# Patient Record
Sex: Female | Born: 1970 | Race: White | Hispanic: No | Marital: Married | Smoking: Current every day smoker
Health system: Southern US, Community
[De-identification: ages and names within clinical notes are randomized; demographics above are authoritative.]

## PROBLEM LIST (undated history)

## (undated) DIAGNOSIS — R569 Unspecified convulsions: Secondary | ICD-10-CM

## (undated) DIAGNOSIS — Z789 Other specified health status: Secondary | ICD-10-CM

---

## 2006-05-01 ENCOUNTER — Ambulatory Visit: Payer: Self-pay | Admitting: Nurse Practitioner

## 2010-08-22 ENCOUNTER — Ambulatory Visit: Payer: Self-pay | Admitting: Obstetrics and Gynecology

## 2010-08-29 ENCOUNTER — Ambulatory Visit: Payer: Self-pay | Admitting: Obstetrics and Gynecology

## 2010-08-29 ENCOUNTER — Ambulatory Visit: Payer: Self-pay | Admitting: Oncology

## 2010-09-25 ENCOUNTER — Ambulatory Visit: Payer: Self-pay | Admitting: Oncology

## 2011-03-28 ENCOUNTER — Ambulatory Visit: Payer: Self-pay | Admitting: Obstetrics and Gynecology

## 2011-08-29 ENCOUNTER — Ambulatory Visit: Payer: Self-pay | Admitting: Obstetrics and Gynecology

## 2012-11-19 ENCOUNTER — Ambulatory Visit: Payer: Self-pay | Admitting: Obstetrics and Gynecology

## 2013-12-09 ENCOUNTER — Ambulatory Visit: Payer: Self-pay | Admitting: Obstetrics and Gynecology

## 2015-02-10 ENCOUNTER — Other Ambulatory Visit: Payer: Self-pay | Admitting: Obstetrics and Gynecology

## 2015-02-10 DIAGNOSIS — Z1231 Encounter for screening mammogram for malignant neoplasm of breast: Secondary | ICD-10-CM

## 2015-02-17 ENCOUNTER — Ambulatory Visit
Admission: RE | Admit: 2015-02-17 | Discharge: 2015-02-17 | Disposition: A | Discharge status: Home / Self Care | Payer: Managed Care, Other (non HMO) | Source: Ambulatory Visit | Attending: Obstetrics and Gynecology | Admitting: Obstetrics and Gynecology

## 2015-02-17 DIAGNOSIS — Z1231 Encounter for screening mammogram for malignant neoplasm of breast: Secondary | ICD-10-CM | POA: Diagnosis not present

## 2016-03-04 ENCOUNTER — Other Ambulatory Visit: Payer: Self-pay | Admitting: Obstetrics and Gynecology

## 2016-03-04 DIAGNOSIS — Z1231 Encounter for screening mammogram for malignant neoplasm of breast: Secondary | ICD-10-CM

## 2016-03-29 ENCOUNTER — Ambulatory Visit: Payer: Managed Care, Other (non HMO)

## 2016-04-12 ENCOUNTER — Ambulatory Visit
Admission: RE | Admit: 2016-04-12 | Discharge: 2016-04-12 | Disposition: A | Discharge status: Home / Self Care | Payer: Managed Care, Other (non HMO) | Source: Ambulatory Visit | Attending: Obstetrics and Gynecology | Admitting: Obstetrics and Gynecology

## 2016-04-12 DIAGNOSIS — Z1231 Encounter for screening mammogram for malignant neoplasm of breast: Secondary | ICD-10-CM | POA: Diagnosis present

## 2016-09-30 ENCOUNTER — Encounter: Payer: Self-pay | Admitting: *Deleted

## 2016-10-03 ENCOUNTER — Ambulatory Visit
Admission: RE | Admit: 2016-10-03 | Discharge: 2016-10-03 | Disposition: A | Discharge status: Home / Self Care | Payer: Managed Care, Other (non HMO) | Source: Ambulatory Visit | Attending: Ophthalmology | Admitting: Ophthalmology

## 2016-10-03 ENCOUNTER — Ambulatory Visit: Payer: Managed Care, Other (non HMO) | Admitting: Anesthesiology

## 2016-10-03 ENCOUNTER — Encounter
Admission: RE | Disposition: A | Discharge status: Home / Self Care | Payer: Self-pay | Source: Ambulatory Visit | Attending: Ophthalmology

## 2016-10-03 ENCOUNTER — Encounter: Payer: Self-pay | Admitting: *Deleted

## 2016-10-03 DIAGNOSIS — H25041 Posterior subcapsular polar age-related cataract, right eye: Secondary | ICD-10-CM | POA: Insufficient documentation

## 2016-10-03 DIAGNOSIS — R569 Unspecified convulsions: Secondary | ICD-10-CM | POA: Insufficient documentation

## 2016-10-03 DIAGNOSIS — F17200 Nicotine dependence, unspecified, uncomplicated: Secondary | ICD-10-CM | POA: Diagnosis not present

## 2016-10-03 HISTORY — PX: CATARACT EXTRACTION W/PHACO: SHX586

## 2016-10-03 HISTORY — DX: Other specified health status: Z78.9

## 2016-10-03 HISTORY — DX: Unspecified convulsions: R56.9

## 2016-10-03 LAB — POCT PREGNANCY, URINE: PREG TEST UR: NEGATIVE

## 2016-10-03 SURGERY — PHACOEMULSIFICATION, CATARACT, WITH IOL INSERTION
Anesthesia: Monitor Anesthesia Care | Site: Eye | Laterality: Right | Wound class: Clean

## 2016-10-03 MED ORDER — SODIUM CHLORIDE 0.9 % IV SOLN
INTRAVENOUS | Status: DC
  Administered 2016-10-03: 08:00:00 via INTRAVENOUS

## 2016-10-03 MED ORDER — NA CHONDROIT SULF-NA HYALURON 40-30 MG/ML IO SOLN
INTRAOCULAR | Status: AC
  Filled 2016-10-03: qty 0.5

## 2016-10-03 MED ORDER — MIDAZOLAM HCL 2 MG/2ML IJ SOLN
INTRAMUSCULAR | Status: DC | PRN
  Administered 2016-10-03 (×2): 1 mg via INTRAVENOUS

## 2016-10-03 MED ORDER — FENTANYL CITRATE (PF) 100 MCG/2ML IJ SOLN
INTRAMUSCULAR | Status: AC
  Filled 2016-10-03: qty 2

## 2016-10-03 MED ORDER — MOXIFLOXACIN HCL 0.5 % OP SOLN
OPHTHALMIC | Status: DC | PRN
  Administered 2016-10-03: 0.2 mL via OPHTHALMIC

## 2016-10-03 MED ORDER — EPINEPHRINE PF 1 MG/ML IJ SOLN
INTRAMUSCULAR | Status: DC | PRN
  Administered 2016-10-03: 09:00:00 via OPHTHALMIC

## 2016-10-03 MED ORDER — POVIDONE-IODINE 5 % OP SOLN
OPHTHALMIC | Status: AC
  Filled 2016-10-03: qty 30

## 2016-10-03 MED ORDER — MOXIFLOXACIN HCL 0.5 % OP SOLN: 1.0000 [drp] | Freq: Once | OPHTHALMIC | Status: DC

## 2016-10-03 MED ORDER — SODIUM HYALURONATE 23 MG/ML IO SOLN
INTRAOCULAR | Status: DC | PRN
  Administered 2016-10-03: 0.6 mL via INTRAOCULAR

## 2016-10-03 MED ORDER — NEOMYCIN-POLYMYXIN-DEXAMETH 3.5-10000-0.1 OP OINT
TOPICAL_OINTMENT | OPHTHALMIC | Status: AC
  Filled 2016-10-03: qty 3.5

## 2016-10-03 MED ORDER — MOXIFLOXACIN HCL 0.5 % OP SOLN
OPHTHALMIC | Status: AC
  Filled 2016-10-03: qty 3

## 2016-10-03 MED ORDER — NA CHONDROIT SULF-NA HYALURON 40-30 MG/ML IO SOLN
INTRAOCULAR | Status: DC | PRN
  Administered 2016-10-03: 0.5 mL via INTRAOCULAR

## 2016-10-03 MED ORDER — FENTANYL CITRATE (PF) 100 MCG/2ML IJ SOLN: 25.0000 ug | INTRAMUSCULAR | Status: DC | PRN

## 2016-10-03 MED ORDER — SODIUM HYALURONATE 10 MG/ML IO SOLN
INTRAOCULAR | Status: DC | PRN
  Administered 2016-10-03: 0.55 mL via INTRAOCULAR

## 2016-10-03 MED ORDER — LIDOCAINE HCL (PF) 4 % IJ SOLN
INTRAMUSCULAR | Status: DC | PRN
  Administered 2016-10-03: 4 mL via OPHTHALMIC

## 2016-10-03 MED ORDER — POVIDONE-IODINE 5 % OP SOLN
OPHTHALMIC | Status: DC | PRN
  Administered 2016-10-03: 1 via OPHTHALMIC

## 2016-10-03 MED ORDER — LIDOCAINE HCL (PF) 2 % IJ SOLN
INTRAMUSCULAR | Status: AC
  Filled 2016-10-03: qty 2

## 2016-10-03 MED ORDER — SODIUM HYALURONATE 10 MG/ML IO SOLN
INTRAOCULAR | Status: AC
  Filled 2016-10-03: qty 0.85

## 2016-10-03 MED ORDER — FENTANYL CITRATE (PF) 100 MCG/2ML IJ SOLN
INTRAMUSCULAR | Status: DC | PRN
  Administered 2016-10-03 (×4): 25 ug via INTRAVENOUS

## 2016-10-03 MED ORDER — EPINEPHRINE PF 1 MG/ML IJ SOLN
INTRAMUSCULAR | Status: AC
  Filled 2016-10-03: qty 2

## 2016-10-03 MED ORDER — ARMC OPHTHALMIC DILATING DROPS
1.0000 "application " | OPHTHALMIC | Status: AC
  Administered 2016-10-03 (×3): 1 via OPHTHALMIC

## 2016-10-03 MED ORDER — SODIUM HYALURONATE 23 MG/ML IO SOLN
INTRAOCULAR | Status: AC
Start: 2016-10-03 — End: 2016-10-03
  Filled 2016-10-03: qty 0.6

## 2016-10-03 MED ORDER — ONDANSETRON HCL 4 MG/2ML IJ SOLN: 4.0000 mg | Freq: Once | INTRAMUSCULAR | Status: DC | PRN

## 2016-10-03 MED ORDER — ARMC OPHTHALMIC DILATING DROPS
OPHTHALMIC | Status: AC
  Administered 2016-10-03: 1 via OPHTHALMIC
  Filled 2016-10-03: qty 0.4

## 2016-10-03 MED ORDER — MIDAZOLAM HCL 2 MG/2ML IJ SOLN
INTRAMUSCULAR | Status: AC
  Filled 2016-10-03: qty 2

## 2016-10-03 SURGICAL SUPPLY — 22 items
BANDAGE EYE OVAL (MISCELLANEOUS) ×4 IMPLANT
CANNULA ANT/CHMB 27GA (MISCELLANEOUS) ×4 IMPLANT
CUP MEDICINE 2OZ PLAST GRAD ST (MISCELLANEOUS) ×2 IMPLANT
DISSECTOR HYDRO NUCLEUS 50X22 (MISCELLANEOUS) ×2 IMPLANT
GLOVE BIO SURGEON STRL SZ8 (GLOVE) ×2 IMPLANT
GLOVE BIOGEL M 6.5 STRL (GLOVE) ×2 IMPLANT
GLOVE SURG LX 7.5 STRW (GLOVE) ×1
GLOVE SURG LX STRL 7.5 STRW (GLOVE) ×1 IMPLANT
GOWN STRL REUS W/ TWL LRG LVL3 (GOWN DISPOSABLE) ×2 IMPLANT
GOWN STRL REUS W/TWL LRG LVL3 (GOWN DISPOSABLE) ×2
LENS IOL TECNIS ITEC 25.0 (Intraocular Lens) ×2 IMPLANT
PACK CATARACT (MISCELLANEOUS) ×2 IMPLANT
PACK CATARACT KING (MISCELLANEOUS) ×2 IMPLANT
PACK EYE AFTER SURG (MISCELLANEOUS) ×2 IMPLANT
SOL BSS BAG (MISCELLANEOUS) ×2
SOLUTION BSS BAG (MISCELLANEOUS) ×1 IMPLANT
SUT ETHILON 10 0 CS140 6 (SUTURE) ×2 IMPLANT
SYR 3ML LL SCALE MARK (SYRINGE) ×4 IMPLANT
SYR 5ML LL (SYRINGE) ×2 IMPLANT
SYR TB 1ML 27GX1/2 LL (SYRINGE) ×2 IMPLANT
WATER STERILE IRR 250ML POUR (IV SOLUTION) ×2 IMPLANT
WIPE NON LINTING 3.25X3.25 (MISCELLANEOUS) ×2 IMPLANT

## 2016-10-03 NOTE — Discharge Instructions (Signed)
Eye Surgery Discharge Instructions  Expect mild scratchy sensation or mild soreness. DO NOT RUB YOUR EYE!  The day of surgery:  Minimal physical activity, but bed rest is not required  No reading, computer work, or close hand work  No bending, lifting, or straining.  May watch TV  For 24 hours:  No driving, legal decisions, or alcoholic beverages  Safety precautions  Eat anything you prefer: It is better to start with liquids, then soup then solid foods.  _____ Eye patch should be worn until postoperative exam tomorrow.  ____ Solar shield eyeglasses should be worn for comfort in the sunlight/patch while sleeping  Resume all regular medications including aspirin or Coumadin if these were discontinued prior to surgery. You may shower, bathe, shave, or wash your hair. Tylenol may be taken for mild discomfort.  Call your doctor if you experience significant pain, nausea, or vomiting, fever > 101 or other signs of infection. [phone] or [phone] Specific instructions:  Follow-up Information    [place] CraneKing, Bradley Mark, MD Follow up on 10/04/2016.   Specialty:  Ophthalmology Why:  9:30 am Contact information: [address] [place] [address] [phone]

## 2016-10-03 NOTE — Anesthesia Preprocedure Evaluation (Signed)
Anesthesia Evaluation  Patient identified by MRN, date of birth, ID band Patient awake    Reviewed: Allergy & Precautions, NPO status , Patient's Chart, lab work & pertinent test results, reviewed documented beta blocker date and time   Airway Mallampati: II  TM Distance: >3 FB     Dental  (+) Chipped   Pulmonary Current Smoker,           Cardiovascular      Neuro/Psych Seizures -,     GI/Hepatic   Endo/Other    Renal/GU      Musculoskeletal   Abdominal   Peds  Hematology   Anesthesia Other Findings   Reproductive/Obstetrics                             Anesthesia Physical Anesthesia Plan  ASA: II  Anesthesia Plan: MAC   Post-op Pain Management:    Induction:   Airway Management Planned:   Additional Equipment:   Intra-op Plan:   Post-operative Plan:   Informed Consent: I have reviewed the patients History and Physical, chart, labs and discussed the procedure including the risks, benefits and alternatives for the proposed anesthesia with the patient or authorized representative who has indicated his/her understanding and acceptance.     Plan Discussed with: CRNA  Anesthesia Plan Comments:         Anesthesia Quick Evaluation

## 2016-10-03 NOTE — Anesthesia Post-op Follow-up Note (Cosign Needed)
Anesthesia QCDR form completed.        

## 2016-10-03 NOTE — Anesthesia Postprocedure Evaluation (Signed)
Anesthesia Post Note  Patient: Beth Nguyen  Procedure(s) Performed: Procedure(s) (LRB): CATARACT EXTRACTION PHACO AND INTRAOCULAR LENS PLACEMENT (IOC) (Right)  Patient location during evaluation: PACU Anesthesia Type: MAC Level of consciousness: awake and alert Pain management: pain level controlled Vital Signs Assessment: post-procedure vital signs reviewed and stable Respiratory status: spontaneous breathing, nonlabored ventilation, respiratory function stable and patient connected to nasal cannula oxygen Cardiovascular status: stable and blood pressure returned to baseline Anesthetic complications: no     Last Vitals:  Vitals:   10/03/16 0743 10/03/16 0939  BP: 99/62 110/71  Pulse: 66 64  Resp: 20 12  Temp: 36.6 C 36.3 C    Last Pain:  Vitals:   10/03/16 0939  TempSrc: Temporal                 Aengus Sauceda,  Clearnce Sorrel

## 2016-10-03 NOTE — Op Note (Signed)
OPERATIVE NOTE  Beth MeyerGeorginia J Nguyen [id number] 10/03/2016   PREOPERATIVE DIAGNOSIS:  Nuclear sclerotic cataract right eye.  H25.11  and posterior polar cataract, right eye.   POSTOPERATIVE DIAGNOSIS:    Nuclear sclerotic cataract right eye.   and posterior polar cataract, right eye.    PROCEDURE:  Phacoemusification with posterior chamber intraocular lens placement of the right eye   LENS:   Implant Name Type Inv. Item Serial No. Manufacturer Lot No. LRB No. Used  LENS IOL DIOP 25.0 - [id number] Intraocular Lens LENS IOL DIOP 25.0 [id number] [id number] AMO   Right 1       PCB00 +25.0   ULTRASOUND TIME: 0 minutes 6.4 seconds.  CDE 0.11   SURGEON:  Willey BladeBradley Kayode Petion, MD, MPH  ANESTHESIOLOGIST: Anesthesiologist: Berdine Addisonhomas, Mathai, MD CRNA: Irving BurtonBachich, Jennifer, CRNA; Ginger CarneMichelet, Stephanie, CRNA   ANESTHESIA:  Topical with tetracaine drops augmented with 1% preservative-free intracameral lidocaine.  ESTIMATED BLOOD LOSS: less than 1 mL.   COMPLICATIONS:  None.   DESCRIPTION OF PROCEDURE:  The patient was identified in the holding room and transported to the operating room and placed in the supine position under the operating microscope.  The right eye was identified as the operative eye and it was prepped and draped in the usual sterile ophthalmic fashion.   A 1.0 millimeter clear-corneal paracentesis was made at the 10:30 position. 0.5 ml of preservative-free 1% lidocaine with epinephrine was injected into the anterior chamber.  The anterior chamber was filled with Healon 5 viscoelastic.  A 2.4 millimeter keratome was used to make a near-clear corneal incision at the 8:00 position.  A curvilinear capsulorrhexis was made with a cystotome and capsulorrhexis forceps.    Given the posterior polar cataract, hydrodissection was NOT performed.   Gentle slow phacoemulsification was performed to remove the nucleus and part of the epinucleus.  Viscoat was used to employ the "reverse flower bloom" technique for  viscodissection down to the posterior polar cataract.  The remaining lens fragments were removed using the I/A handpiece.  Healon was then placed into the capsular bag to distend it for lens placement.  A lens was then injected into the capsular bag.  The remaining viscoelastic was aspirated.  There was moderate positive posterior pressure.   Wounds were hydrated with balanced salt solution.  During hydration of the paracentesis wound an accidental hydrodissection of 1/4 of the superotemporal cornea was performed, presumbably the epithelium.  The wounds remained leaky.  A 10-0 nylon was placed at the temporal wound.   Intracameral vigamox 0.1 mL undiluted was injected into the eye and a drop placed onto the ocular surface.  Maxitrol ointment, a patch and shield were placed on the eye.  No wound leaks were noted.  The patient was taken to the recovery room in stable condition.  Willey BladeBradley Rehmat Murtagh 10/03/2016, 9:37 AM

## 2016-10-03 NOTE — H&P (Signed)
The History and Physical notes are on paper, have been signed, and are to be scanned.   I have examined the patient and there are no changes to the H&P.   Willey BladeBradley Maitlyn Penza 10/03/2016 8:40 AM

## 2016-10-03 NOTE — Transfer of Care (Signed)
Immediate Anesthesia Transfer of Care Note  Patient: Beth Nguyen  Procedure(s) Performed: Procedure(s) with comments: CATARACT EXTRACTION PHACO AND INTRAOCULAR LENS PLACEMENT (IOC) (Right) - [place] 6.4 AP% 1.7 CDE 0.11 Fluid Pack lot # [id number]  Patient Location: PACU  Anesthesia Type:MAC  Level of Consciousness: awake, alert  and oriented  Airway & Oxygen Therapy: Patient Spontanous Breathing  Post-op Assessment: Report given to RN  Post vital signs: stable  Last Vitals:  Vitals:   10/03/16 0743 10/03/16 0939  BP: 99/62 110/71  Pulse: 66 64  Resp: 20 12  Temp: 36.6 C 36.3 C    Last Pain:  Vitals:   10/03/16 0939  TempSrc: Temporal      Patients Stated Pain Goal: 0 (53/29/92 4268)  Complications: No apparent anesthesia complications

## 2016-12-19 ENCOUNTER — Encounter: Payer: Self-pay | Admitting: *Deleted

## 2016-12-26 ENCOUNTER — Ambulatory Visit
Admission: RE | Admit: 2016-12-26 | Discharge: 2016-12-26 | Disposition: A | Discharge status: Home / Self Care | Payer: Managed Care, Other (non HMO) | Source: Ambulatory Visit | Attending: Ophthalmology | Admitting: Ophthalmology

## 2016-12-26 ENCOUNTER — Encounter: Payer: Self-pay | Admitting: *Deleted

## 2016-12-26 ENCOUNTER — Ambulatory Visit: Payer: Managed Care, Other (non HMO) | Admitting: Anesthesiology

## 2016-12-26 ENCOUNTER — Encounter
Admission: RE | Disposition: A | Discharge status: Home / Self Care | Payer: Self-pay | Source: Ambulatory Visit | Attending: Ophthalmology

## 2016-12-26 DIAGNOSIS — H2512 Age-related nuclear cataract, left eye: Secondary | ICD-10-CM | POA: Insufficient documentation

## 2016-12-26 DIAGNOSIS — H268 Other specified cataract: Secondary | ICD-10-CM | POA: Insufficient documentation

## 2016-12-26 DIAGNOSIS — F17200 Nicotine dependence, unspecified, uncomplicated: Secondary | ICD-10-CM | POA: Insufficient documentation

## 2016-12-26 HISTORY — PX: CATARACT EXTRACTION W/PHACO: SHX586

## 2016-12-26 SURGERY — PHACOEMULSIFICATION, CATARACT, WITH IOL INSERTION
Anesthesia: Monitor Anesthesia Care | Site: Eye | Laterality: Left | Wound class: Clean

## 2016-12-26 MED ORDER — NA CHONDROIT SULF-NA HYALURON 40-30 MG/ML IO SOLN
INTRAOCULAR | Status: DC | PRN
  Administered 2016-12-26: 1 mL via INTRAOCULAR

## 2016-12-26 MED ORDER — FENTANYL CITRATE (PF) 100 MCG/2ML IJ SOLN
INTRAMUSCULAR | Status: AC
  Filled 2016-12-26: qty 2

## 2016-12-26 MED ORDER — SODIUM HYALURONATE 10 MG/ML IO SOLN
INTRAOCULAR | Status: DC | PRN
  Administered 2016-12-26: 0.55 mL via INTRAOCULAR

## 2016-12-26 MED ORDER — LIDOCAINE HCL (PF) 4 % IJ SOLN
INTRAOCULAR | Status: DC | PRN
  Administered 2016-12-26: 4 mL via OPHTHALMIC

## 2016-12-26 MED ORDER — SODIUM CHLORIDE 0.9 % IV SOLN
INTRAVENOUS | Status: DC
  Administered 2016-12-26: 50 mL/h via INTRAVENOUS

## 2016-12-26 MED ORDER — MOXIFLOXACIN HCL 0.5 % OP SOLN
OPHTHALMIC | Status: AC
  Filled 2016-12-26: qty 3

## 2016-12-26 MED ORDER — SODIUM HYALURONATE 23 MG/ML IO SOLN
INTRAOCULAR | Status: DC | PRN
  Administered 2016-12-26: 0.6 mL via INTRAOCULAR

## 2016-12-26 MED ORDER — LIDOCAINE HCL (PF) 4 % IJ SOLN
INTRAMUSCULAR | Status: AC
  Filled 2016-12-26: qty 5

## 2016-12-26 MED ORDER — MIDAZOLAM HCL 2 MG/2ML IJ SOLN
INTRAMUSCULAR | Status: DC | PRN
Start: 2016-12-26 — End: 2016-12-26
  Administered 2016-12-26: 2 mg via INTRAVENOUS

## 2016-12-26 MED ORDER — EPINEPHRINE PF 1 MG/ML IJ SOLN
INTRAMUSCULAR | Status: AC
  Filled 2016-12-26: qty 2

## 2016-12-26 MED ORDER — ARMC OPHTHALMIC DILATING DROPS
1.0000 "application " | OPHTHALMIC | Status: DC
  Administered 2016-12-26 (×4): 1 via OPHTHALMIC

## 2016-12-26 MED ORDER — MOXIFLOXACIN HCL 0.5 % OP SOLN
OPHTHALMIC | Status: DC | PRN
  Administered 2016-12-26: 0.2 mL via OPHTHALMIC

## 2016-12-26 MED ORDER — POVIDONE-IODINE 5 % OP SOLN
OPHTHALMIC | Status: DC | PRN
  Administered 2016-12-26: 1 via OPHTHALMIC

## 2016-12-26 MED ORDER — CARBACHOL 0.01 % IO SOLN
INTRAOCULAR | Status: DC | PRN
  Administered 2016-12-26: 0.5 mL via INTRAOCULAR

## 2016-12-26 MED ORDER — MIDAZOLAM HCL 2 MG/2ML IJ SOLN
INTRAMUSCULAR | Status: AC
  Filled 2016-12-26: qty 2

## 2016-12-26 MED ORDER — SODIUM HYALURONATE 23 MG/ML IO SOLN
INTRAOCULAR | Status: AC
  Filled 2016-12-26: qty 0.6

## 2016-12-26 MED ORDER — ARMC OPHTHALMIC DILATING DROPS
OPHTHALMIC | Status: AC
  Administered 2016-12-26: 1 via OPHTHALMIC
  Filled 2016-12-26: qty 0.4

## 2016-12-26 MED ORDER — BSS IO SOLN
INTRAOCULAR | Status: DC | PRN
  Administered 2016-12-26: 08:00:00 via OPHTHALMIC

## 2016-12-26 MED ORDER — POVIDONE-IODINE 5 % OP SOLN
OPHTHALMIC | Status: AC
  Filled 2016-12-26: qty 30

## 2016-12-26 MED ORDER — MOXIFLOXACIN HCL 0.5 % OP SOLN: 1.0000 [drp] | OPHTHALMIC | Status: DC | PRN

## 2016-12-26 MED ORDER — FENTANYL CITRATE (PF) 100 MCG/2ML IJ SOLN
INTRAMUSCULAR | Status: DC | PRN
  Administered 2016-12-26 (×2): 50 ug via INTRAVENOUS

## 2016-12-26 SURGICAL SUPPLY — 16 items
DISSECTOR HYDRO NUCLEUS 50X22 (MISCELLANEOUS) ×2 IMPLANT
GLOVE BIO SURGEON STRL SZ8 (GLOVE) ×2 IMPLANT
GLOVE BIOGEL M 6.5 STRL (GLOVE) ×2 IMPLANT
GLOVE SURG LX 7.5 STRW (GLOVE) ×1
GLOVE SURG LX STRL 7.5 STRW (GLOVE) ×1 IMPLANT
GOWN STRL REUS W/ TWL LRG LVL3 (GOWN DISPOSABLE) ×2 IMPLANT
GOWN STRL REUS W/TWL LRG LVL3 (GOWN DISPOSABLE) ×2
LABEL CATARACT MEDS ST (LABEL) ×2 IMPLANT
LENS IOL TECNIS ITEC 24.5 (Intraocular Lens) ×2 IMPLANT
PACK CATARACT (MISCELLANEOUS) ×2 IMPLANT
PACK CATARACT KING (MISCELLANEOUS) ×2 IMPLANT
PACK EYE AFTER SURG (MISCELLANEOUS) ×2 IMPLANT
SOL BSS BAG (MISCELLANEOUS) ×2
SOLUTION BSS BAG (MISCELLANEOUS) ×1 IMPLANT
WATER STERILE IRR 250ML POUR (IV SOLUTION) ×2 IMPLANT
WIPE NON LINTING 3.25X3.25 (MISCELLANEOUS) ×2 IMPLANT

## 2016-12-26 NOTE — Anesthesia Post-op Follow-up Note (Cosign Needed)
Anesthesia QCDR form completed.        

## 2016-12-26 NOTE — Anesthesia Postprocedure Evaluation (Signed)
Anesthesia Post Note  Patient: Beth Nguyen  Procedure(s) Performed: Procedure(s) (LRB): CATARACT EXTRACTION PHACO AND INTRAOCULAR LENS PLACEMENT (IOC) (Left)  Patient location during evaluation: Short Stay Anesthesia Type: MAC Level of consciousness: awake, awake and alert and oriented Pain management: pain level controlled Vital Signs Assessment: post-procedure vital signs reviewed and stable Respiratory status: spontaneous breathing Cardiovascular status: stable Postop Assessment: no headache and adequate PO intake Anesthetic complications: no     Last Vitals:  Vitals:   12/26/16 0651 12/26/16 0846  BP: 98/69 102/71  Pulse: 69 62  Resp: 16 16  Temp: 36.5 C     Last Pain:  Vitals:   12/26/16 0651  TempSrc: Burnett Corrente

## 2016-12-26 NOTE — Discharge Instructions (Signed)

## 2016-12-26 NOTE — H&P (Signed)
The History and Physical notes are on paper, have been signed, and are to be scanned.   I have examined the patient and there are no changes to the H&P.   Willey BladeBradley Calbert Hulsebus 12/26/2016 10:34 AM

## 2016-12-26 NOTE — Transfer of Care (Signed)
Immediate Anesthesia Transfer of Care Note  Patient: Beth Nguyen  Procedure(s) Performed: Procedure(s) with comments: CATARACT EXTRACTION PHACO AND INTRAOCULAR LENS PLACEMENT (IOC) (Left) - [place] 00:09.1 AP% 1.8 CDE 0.16 FLUID PACK LOT # [id number]  Patient Location: PACU and Short Stay  Anesthesia Type:MAC  Level of Consciousness: awake, alert  and oriented  Airway & Oxygen Therapy: Patient Spontanous Breathing  Post-op Assessment: Report given to RN  Post vital signs: Reviewed and stable  Last Vitals:  Vitals:   12/26/16 0651 12/26/16 0846  BP: 98/69 102/71  Pulse: 69 62  Resp: 16 16  Temp: 36.5 C     Last Pain:  Vitals:   12/26/16 0651  TempSrc: Oral      Patients Stated Pain Goal: 0 (70/14/10 3013)  Complications: No apparent anesthesia complications

## 2016-12-26 NOTE — Op Note (Signed)
OPERATIVE NOTE  Beth MeyerGeorginia J Nguyen [id number] 12/26/2016   PREOPERATIVE DIAGNOSIS:  Nuclear sclerotic cataract left eye.  H25.12 and posterior polar cataract, left eye.   POSTOPERATIVE DIAGNOSIS:    same.     PROCEDURE:  Phacoemusification with posterior chamber intraocular lens placement of the left eye   LENS:   Implant Name Type Inv. Item Serial No. Manufacturer Lot No. LRB No. Used  LENS IOL DIOP 24.5 - [id number] Intraocular Lens LENS IOL DIOP 24.5 [id number] AMO   Left 1       PCB00 +24.5   ULTRASOUND TIME: 0 minutes 9 seconds.  CDE 0.16   SURGEON:  Willey BladeBradley Evalyn Shultis, MD, MPH   ANESTHESIA:  Topical with tetracaine drops augmented with 1% preservative-free intracameral lidocaine.  ESTIMATED BLOOD LOSS: <1 mL   COMPLICATIONS:  None.   DESCRIPTION OF PROCEDURE:  The patient was identified in the holding room and transported to the operating room and placed in the supine position under the operating microscope.  The left eye was identified as the operative eye and it was prepped and draped in the usual sterile ophthalmic fashion.   A 1.0 millimeter clear-corneal paracentesis was made at the 5:00 position. 0.5 ml of preservative-free 1% lidocaine with epinephrine was injected into the anterior chamber.  The anterior chamber was filled with Healon 5 viscoelastic.  A 2.4 millimeter keratome was used to make a near-clear corneal incision at the 2:00 position.  A curvilinear capsulorrhexis was made with a cystotome and capsulorrhexis forceps.   Because of the posterior polar cataract neither hydrodissection nor hydrodelineation were performed.   Phacoemulsification was then used in stop and chop fashion to remove the lens nucleus and epinucleus.  The posterior polar cataract was aspirated without difficulty or compromise to the posterior capsule. The remaining cortex was then removed using the irrigation and aspiration handpiece.   Healon was then placed into the capsular bag to distend it  for lens placement.  A lens was then injected into the capsular bag.  The remaining viscoelastic was aspirated.   Wounds were hydrated with balanced salt solution.  The anterior chamber was inflated to a physiologic pressure with balanced salt solution.   Intracameral vigamox 0.1 mL undiltued was injected into the eye and a drop placed onto the ocular surface.  No wound leaks were noted.  The patient was taken to the recovery room in stable condition without complications of anesthesia or surgery  Willey BladeBradley [name] 12/26/2016, 8:43 AM

## 2016-12-26 NOTE — Anesthesia Preprocedure Evaluation (Signed)
Anesthesia Evaluation  Patient identified by MRN, date of birth, ID band Patient awake    Reviewed: Allergy & Precautions, NPO status , Patient's Chart, lab work & pertinent test results  History of Anesthesia Complications Negative for: history of anesthetic complications  Airway Mallampati: II  TM Distance: >3 FB Neck ROM: Full    Dental no notable dental hx.    Pulmonary neg sleep apnea, neg COPD, Current Smoker,    breath sounds clear to auscultation- rhonchi (-) wheezing      Cardiovascular Exercise Tolerance: Good (-) hypertension(-) CAD and (-) Past MI  Rhythm:Regular Rate:Normal - Systolic murmurs and - Diastolic murmurs    Neuro/Psych Seizures - (childhood),  negative psych ROS   GI/Hepatic negative GI ROS, Neg liver ROS,   Endo/Other  negative endocrine ROSneg diabetes  Renal/GU negative Renal ROS     Musculoskeletal negative musculoskeletal ROS (+)   Abdominal (+) - obese,   Peds  Hematology negative hematology ROS (+)   Anesthesia Other Findings Past Medical History: No date: Medical history non-contributory No date: Prematurity     Comment:  patient was approximately 2 months premature. No date: Seizures (HCC)     Comment:  AS A BABY   Reproductive/Obstetrics                             Anesthesia Physical Anesthesia Plan  ASA: II  Anesthesia Plan: MAC   Post-op Pain Management:    Induction: Intravenous  PONV Risk Score and Plan: 1 and Midazolam  Airway Management Planned: Natural Airway  Additional Equipment:   Intra-op Plan:   Post-operative Plan:   Informed Consent: I have reviewed the patients History and Physical, chart, labs and discussed the procedure including the risks, benefits and alternatives for the proposed anesthesia with the patient or authorized representative who has indicated his/her understanding and acceptance.     Plan Discussed  with: CRNA and Anesthesiologist  Anesthesia Plan Comments:         Anesthesia Quick Evaluation

## 2017-08-15 ENCOUNTER — Encounter: Payer: Self-pay | Admitting: Certified Nurse Midwife

## 2017-08-15 ENCOUNTER — Ambulatory Visit (INDEPENDENT_AMBULATORY_CARE_PROVIDER_SITE_OTHER): Payer: Managed Care, Other (non HMO) | Admitting: Certified Nurse Midwife

## 2017-08-15 VITALS — BP 94/60 | HR 81 | Ht 64.0 in | Wt 133.1 lb

## 2017-08-15 DIAGNOSIS — Z0000 Encounter for general adult medical examination without abnormal findings: Secondary | ICD-10-CM

## 2017-08-15 NOTE — Progress Notes (Signed)
GYNECOLOGY ANNUAL PREVENTATIVE CARE ENCOUNTER NOTE  Subjective:   Beth Nguyen is a 47 y.o. G74P0101 female here for a routine annual gynecologic exam.  Current complaints: none.   Denies abnormal vaginal bleeding, discharge, pelvic pain, problems with intercourse or other gynecologic concerns.    Gynecologic History No LMP recorded (lmp unknown). Patient is postmenopausal. Contraception: post menopausal status Last Pap: 2 yrs ago. Results were: normal, has hx of abnormal has had cryo surgery Last mammogram: a few years ago. Results were: normal  Obstetric History OB History  Gravida Para Term Preterm AB Living  1 1   1   1   SAB TAB Ectopic Multiple Live Births          1    # Outcome Date GA Lbr Len/2nd Weight Sex Delivery Anes PTL Lv  1 Preterm 74    F CS-Unspec  Y LIV    Past Medical History:  Diagnosis Date  . Medical history non-contributory   . Prematurity    patient was approximately 2 months premature.  . Seizures (HCC)    AS A BABY    Past Surgical History:  Procedure Laterality Date  . CATARACT EXTRACTION W/PHACO Right 10/03/2016   Procedure: CATARACT EXTRACTION PHACO AND INTRAOCULAR LENS PLACEMENT (IOC);  Surgeon: [place] Crane, MD;  Location: [facility] ORS;  Service: Ophthalmology;  Laterality: Right;  [place] 6.4 AP% 1.7 CDE 0.11 Fluid Pack lot # [id number]  . CATARACT EXTRACTION W/PHACO Left 12/26/2016   Procedure: CATARACT EXTRACTION PHACO AND INTRAOCULAR LENS PLACEMENT (IOC);  Surgeon: [place] Crane, MD;  Location: [facility] ORS;  Service: Ophthalmology;  Laterality: Left;  [place] 00:09.1 AP% 1.8 CDE 0.16 FLUID PACK LOT # [id number]  . CESAREAN SECTION      Current Outpatient Medications on File Prior to Visit  Medication Sig Dispense Refill  . acetaminophen (TYLENOL) 500 MG tablet Take by mouth.     No current facility-administered medications on file prior to visit.     No Known Allergies  Social History   Socioeconomic History  . Marital status:  Married    Spouse name: Not on file  . Number of children: Not on file  . Years of education: Not on file  . Highest education level: Not on file  Occupational History  . Not on file  Social Needs  . Financial resource strain: Not on file  . Food insecurity:    Worry: Not on file    Inability: Not on file  . Transportation needs:    Medical: Not on file    Non-medical: Not on file  Tobacco Use  . Smoking status: Current Every Day Smoker    Packs/day: 1.00  . Smokeless tobacco: Never Used  Substance and Sexual Activity  . Alcohol use: Yes    Comment: OCCAS  . Drug use: Never  . Sexual activity: Yes    Birth control/protection: None  Lifestyle  . Physical activity:    Days per week: Not on file    Minutes per session: Not on file  . Stress: Not on file  Relationships  . Social connections:    Talks on phone: Not on file    Gets together: Not on file    Attends religious service: Not on file    Active member of club or organization: Not on file    Attends meetings of clubs or organizations: Not on file    Relationship status: Not on file  . Intimate partner violence:    Fear  of current or ex partner: Not on file    Emotionally abused: Not on file    Physically abused: Not on file    Forced sexual activity: Not on file  Other Topics Concern  . Not on file  Social History Narrative  . Not on file    Family History  Problem Relation Age of Onset  . Cancer Mother   . Cancer Father   . Diabetes Brother     The following portions of the patient's history were reviewed and updated as appropriate: allergies, current medications, past family history, past medical history, past social history, past surgical history and problem list.  Review of Systems Pertinent items noted in HPI and remainder of comprehensive ROS otherwise negative.   Objective:  BP 94/60   Pulse 81   Ht 5\' 4"  (1.626 m)   Wt 133 lb 1 oz (60.4 kg)   LMP  (LMP Unknown)   BMI 22.84 kg/m   CONSTITUTIONAL: Well-developed, well-nourished female in no acute distress.  HENT:  Normocephalic, atraumatic, External right and left ear normal. Oropharynx is clear and moist EYES: Conjunctivae and EOM are normal. Pupils are equal, round, and reactive to light. No scleral icterus.  NECK: Normal range of motion, supple, no masses.  Normal thyroid.  SKIN: Skin is warm and dry. No rash noted. Not diaphoretic. No erythema. No pallor. NEUROLOGIC: Alert and oriented to person, place, and time. Normal reflexes, muscle tone coordination. No cranial nerve deficit noted. PSYCHIATRIC: Normal mood and affect. Normal behavior. Normal judgment and thought content. CARDIOVASCULAR: Normal heart rate noted, regular rhythm RESPIRATORY: Clear to auscultation bilaterally. Effort and breath sounds normal, no problems with respiration noted. BREASTS: Symmetric in size. No masses, skin changes, nipple drainage, or lymphadenopathy.Left breast @ 9 o'clock 2 in from nipple mass noted that is 1 cm in size round mobile non tender. Pt states she has had it since have her child . It has no changed.  ABDOMEN: Soft, normal bowel sounds, no distention noted.  No tenderness, rebound or guarding.  PELVIC: Normal appearing external genitalia; normal appearing vaginal mucosa and cervix.  No abnormal discharge noted.  Pap smear obtained.  Normal uterine size, no other palpable masses, no uterine or adnexal tenderness. MUSCULOSKELETAL: Normal range of motion. No tenderness.  No cyanosis, clubbing, or edema.  2+ distal pulses.   Assessment and Plan:  1. Annual physical exam - Hemoglobin A1C - Pap IG and HPV (high risk) DNA detection - MS DIGITAL SCREENING BILATERAL; Future  Will follow up results of pap smear and manage accordingly. Mammogram scheduled Smoking cessation encouraged. Discussed lung cancer screening Patient states cholesterol has been checked by primary and declines today.  Routine preventative health maintenance  measures emphasized. Please refer to After Visit Summary for other counseling recommendations.   Doreene BurkeAnnie Toure Edmonds, CNM

## 2017-08-15 NOTE — Patient Instructions (Signed)
Preventive Care 40-64 Years, Female Preventive care refers to lifestyle choices and visits with your health care provider that can promote health and wellness. What does preventive care include?  A yearly physical exam. This is also called an annual well check.  Dental exams once or twice a year.  Routine eye exams. Ask your health care provider how often you should have your eyes checked.  Personal lifestyle choices, including: ? Daily care of your teeth and gums. ? Regular physical activity. ? Eating a healthy diet. ? Avoiding tobacco and drug use. ? Limiting alcohol use. ? Practicing safe sex. ? Taking low-dose aspirin daily starting at age 58. ? Taking vitamin and mineral supplements as recommended by your health care provider. What happens during an annual well check? The services and screenings done by your health care provider during your annual well check will depend on your age, overall health, lifestyle risk factors, and family history of disease. Counseling Your health care provider may ask you questions about your:  Alcohol use.  Tobacco use.  Drug use.  Emotional well-being.  Home and relationship well-being.  Sexual activity.  Eating habits.  Work and work Statistician.  Method of birth control.  Menstrual cycle.  Pregnancy history.  Screening You may have the following tests or measurements:  Height, weight, and BMI.  Blood pressure.  Lipid and cholesterol levels. These may be checked every 5 years, or more frequently if you are over 81 years old.  Skin check.  Lung cancer screening. You may have this screening every year starting at age 78 if you have a 30-pack-year history of smoking and currently smoke or have quit within the past 15 years.  Fecal occult blood test (FOBT) of the stool. You may have this test every year starting at age 65.  Flexible sigmoidoscopy or colonoscopy. You may have a sigmoidoscopy every 5 years or a colonoscopy  every 10 years starting at age 30.  Hepatitis C blood test.  Hepatitis B blood test.  Sexually transmitted disease (STD) testing.  Diabetes screening. This is done by checking your blood sugar (glucose) after you have not eaten for a while (fasting). You may have this done every 1-3 years.  Mammogram. This may be done every 1-2 years. Talk to your health care provider about when you should start having regular mammograms. This may depend on whether you have a family history of breast cancer.  BRCA-related cancer screening. This may be done if you have a family history of breast, ovarian, tubal, or peritoneal cancers.  Pelvic exam and Pap test. This may be done every 3 years starting at age 80. Starting at age 36, this may be done every 5 years if you have a Pap test in combination with an HPV test.  Bone density scan. This is done to screen for osteoporosis. You may have this scan if you are at high risk for osteoporosis.  Discuss your test results, treatment options, and if necessary, the need for more tests with your health care provider. Vaccines Your health care provider may recommend certain vaccines, such as:  Influenza vaccine. This is recommended every year.  Tetanus, diphtheria, and acellular pertussis (Tdap, Td) vaccine. You may need a Td booster every 10 years.  Varicella vaccine. You may need this if you have not been vaccinated.  Zoster vaccine. You may need this after age 5.  Measles, mumps, and rubella (MMR) vaccine. You may need at least one dose of MMR if you were born in  1958 or later. You may also need a second dose.  Pneumococcal 13-valent conjugate (PCV13) vaccine. You may need this if you have certain conditions and were not previously vaccinated.  Pneumococcal polysaccharide (PPSV23) vaccine. You may need one or two doses if you smoke cigarettes or if you have certain conditions.  Meningococcal vaccine. You may need this if you have certain  conditions.  Hepatitis A vaccine. You may need this if you have certain conditions or if you travel or work in places where you may be exposed to hepatitis A.  Hepatitis B vaccine. You may need this if you have certain conditions or if you travel or work in places where you may be exposed to hepatitis B.  Haemophilus influenzae type b (Hib) vaccine. You may need this if you have certain conditions.  Talk to your health care provider about which screenings and vaccines you need and how often you need them. This information is not intended to replace advice given to you by your health care provider. Make sure you discuss any questions you have with your health care provider. Document Released: 06/09/2015 Document Revised: 01/31/2016 Document Reviewed: 03/14/2015 Elsevier Interactive Patient Education  2018 [employer]  

## 2017-08-15 NOTE — Progress Notes (Signed)
New pt is here for a pap smear LPS 2years ago. Has a history of abnormal paps. States she had her cervix froze.

## 2017-08-16 LAB — HEMOGLOBIN A1C
ESTIMATED AVERAGE GLUCOSE: 114 mg/dL
Hgb A1c MFr Bld: 5.6 % (ref 4.8–5.6)

## 2017-08-18 ENCOUNTER — Telehealth: Payer: Self-pay

## 2017-08-18 NOTE — Telephone Encounter (Signed)
Message left re providers instructions. Info mailed.

## 2017-08-19 ENCOUNTER — Telehealth: Payer: Self-pay | Admitting: Certified Nurse Midwife

## 2017-08-19 LAB — PAP IG AND HPV HIGH-RISK
HPV, high-risk: NEGATIVE
PAP SMEAR COMMENT: 0

## 2017-08-19 NOTE — Telephone Encounter (Signed)
The patient called and stated that she would like to have a nurse or Pattricia Bossnnie give her a call back to go over her results in more detail. Please advise.

## 2017-08-20 ENCOUNTER — Telehealth: Payer: Self-pay

## 2017-08-20 NOTE — Telephone Encounter (Signed)
Informed pt of neg pap results per provider. She had a question about A1C- was given the lab values-5.7-6.4 and explained she was right on the edge for prediabetes. She expressed understanding and had no further questions.

## 2017-10-17 ENCOUNTER — Other Ambulatory Visit: Payer: Self-pay | Admitting: Certified Nurse Midwife

## 2017-10-17 ENCOUNTER — Ambulatory Visit
Admission: RE | Admit: 2017-10-17 | Discharge: 2017-10-17 | Disposition: A | Discharge status: Home / Self Care | Payer: Managed Care, Other (non HMO) | Source: Ambulatory Visit | Attending: Certified Nurse Midwife | Admitting: Certified Nurse Midwife

## 2017-10-17 DIAGNOSIS — Z0000 Encounter for general adult medical examination without abnormal findings: Secondary | ICD-10-CM

## 2017-10-21 ENCOUNTER — Telehealth: Payer: Self-pay

## 2017-10-21 NOTE — Telephone Encounter (Signed)
Message left on pts voicemail to please return my call. 

## 2017-10-22 ENCOUNTER — Telehealth: Payer: Self-pay

## 2017-10-22 NOTE — Telephone Encounter (Signed)
Message left on pts voicemail- mammogram normal per AT.

## 2017-10-22 NOTE — Telephone Encounter (Signed)
The patient called at 9:30; returning Sharon's call from yesterday.  Please call the patient back.  Please advise.

## 2017-10-23 ENCOUNTER — Telehealth: Payer: Self-pay

## 2017-10-23 NOTE — Telephone Encounter (Signed)
Pt informed of negative mammogram per AT.

## 2017-11-20 ENCOUNTER — Telehealth: Payer: Self-pay | Admitting: Certified Nurse Midwife

## 2017-11-20 NOTE — Telephone Encounter (Signed)
The patient left a voicemail at 1:33 today and had a question for her providers; not stated by patient [personal] and also wanted to discuss her A1c results.  A good call back number is [phone], please advise, thanks.

## 2017-11-21 ENCOUNTER — Telehealth: Payer: Self-pay

## 2017-11-21 NOTE — Telephone Encounter (Signed)
Voicemail message left for pt to return call if she still has questions.

## 2017-12-26 IMAGING — MG MM DIGITAL SCREENING BILAT W/ TOMO W/ CAD
9 of 12 series · 9 of 28 positions shown · non-contrast
Comparison: Previous exam(s).

CLINICAL DATA: Screening.

EXAM:
2D DIGITAL SCREENING BILATERAL MAMMOGRAM WITH CAD AND ADJUNCT TOMO

[R CC synth-2D]
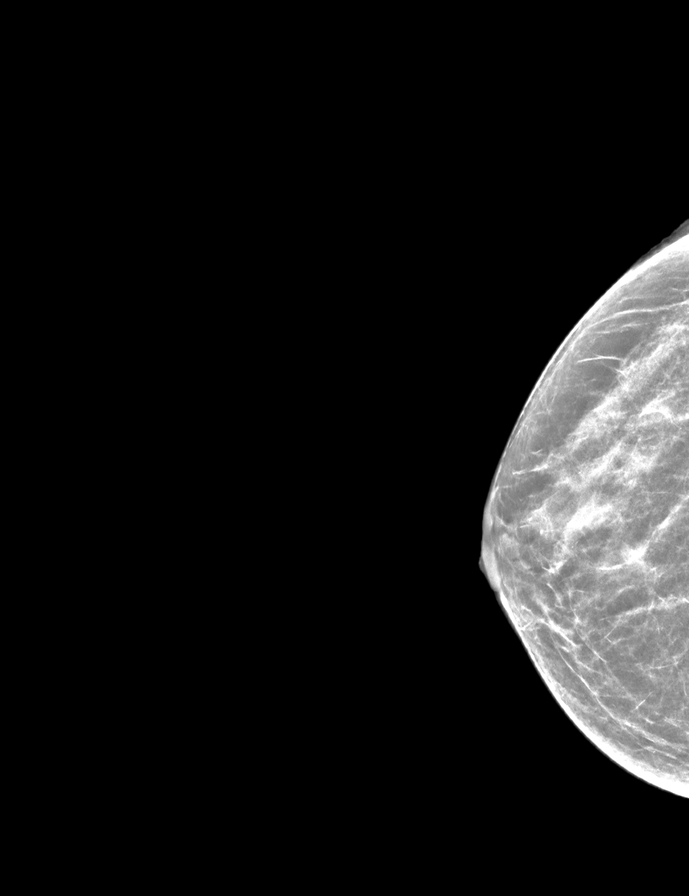

[L CC synth-2D]
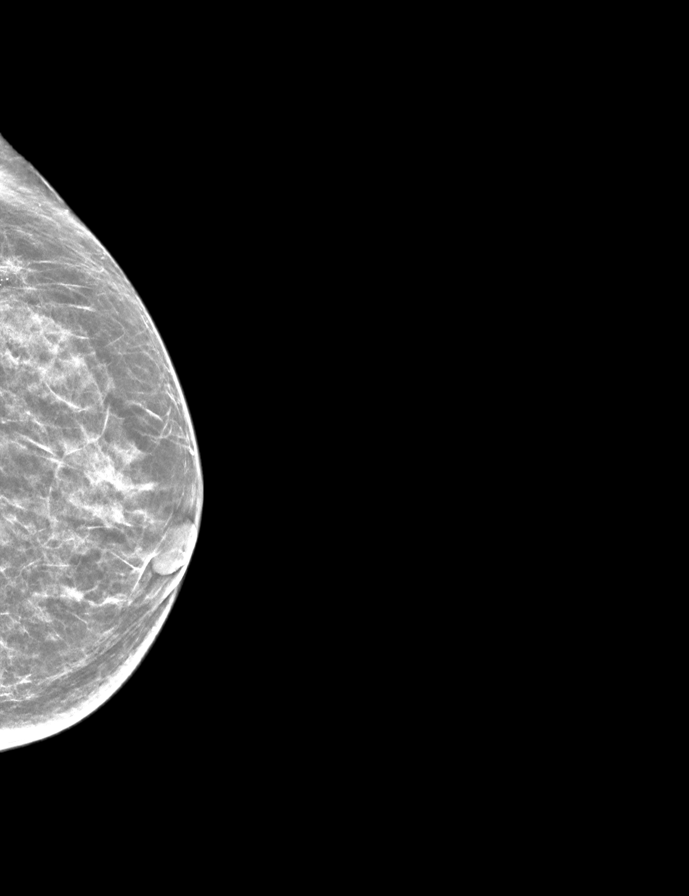

[L CC]
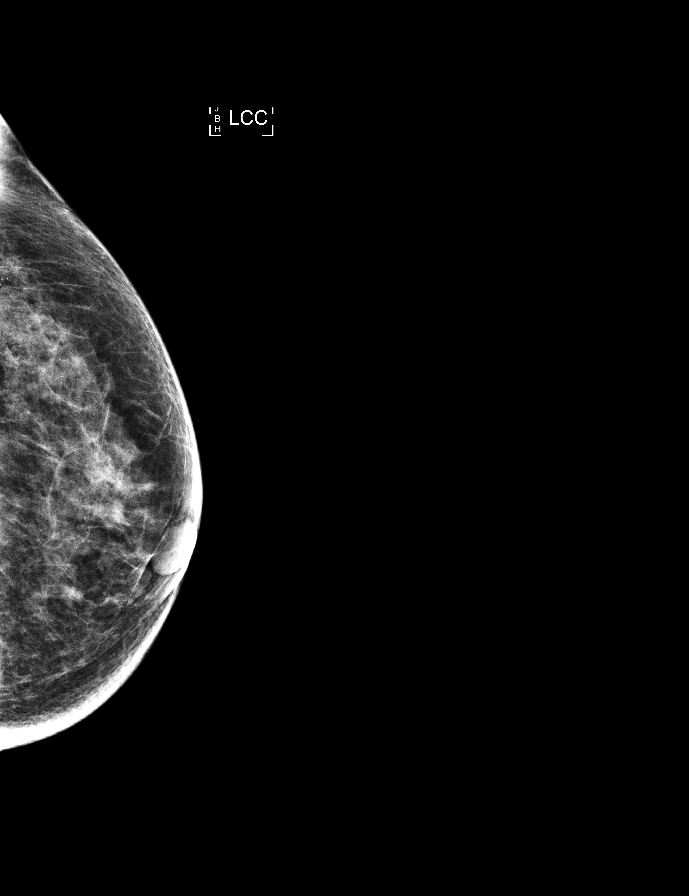

[L MLO]
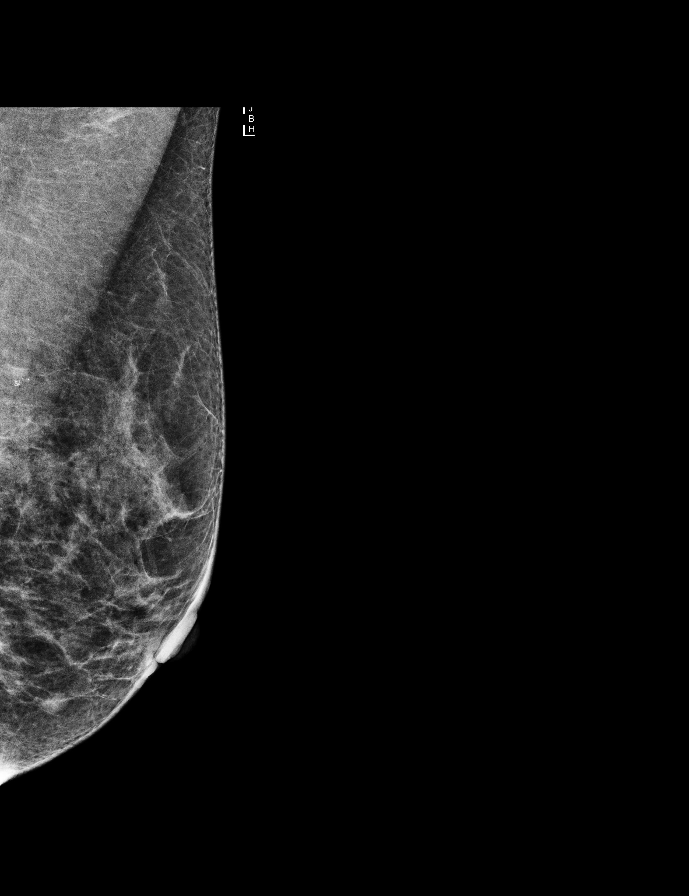

[R MLO]
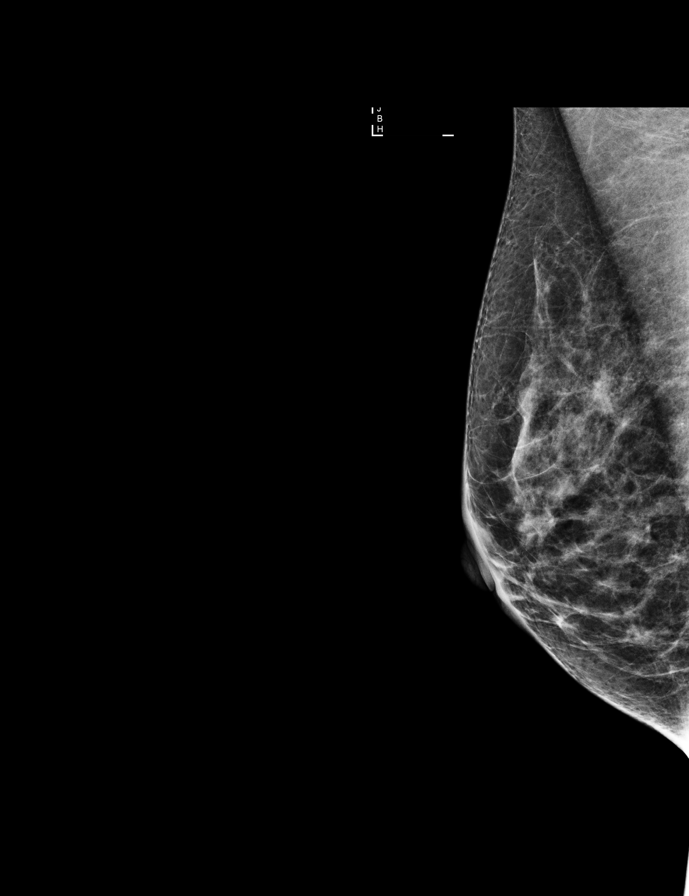

[R CC]
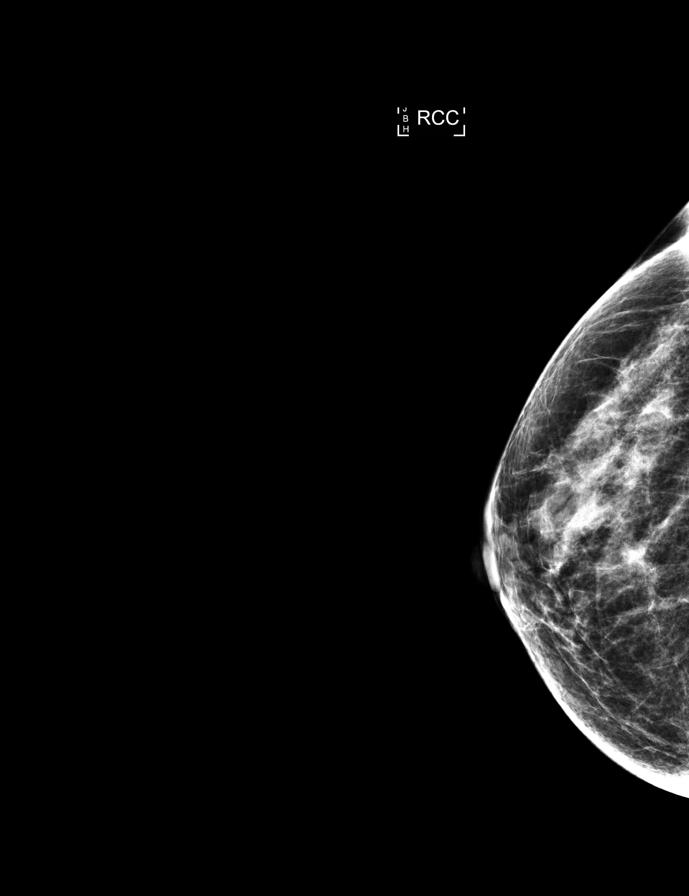

[L MLO synth-2D]
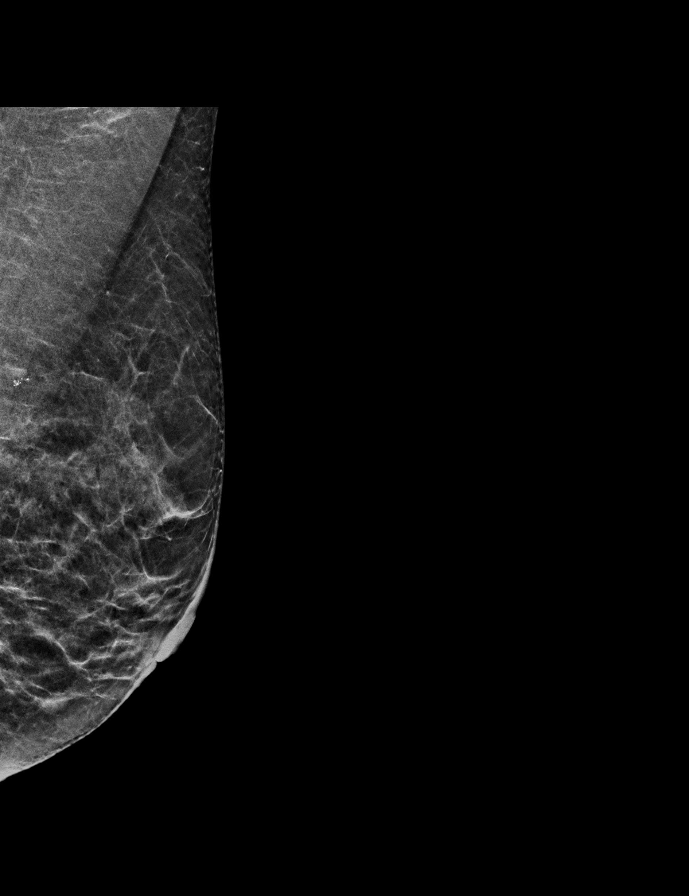

[R MLO synth-2D]
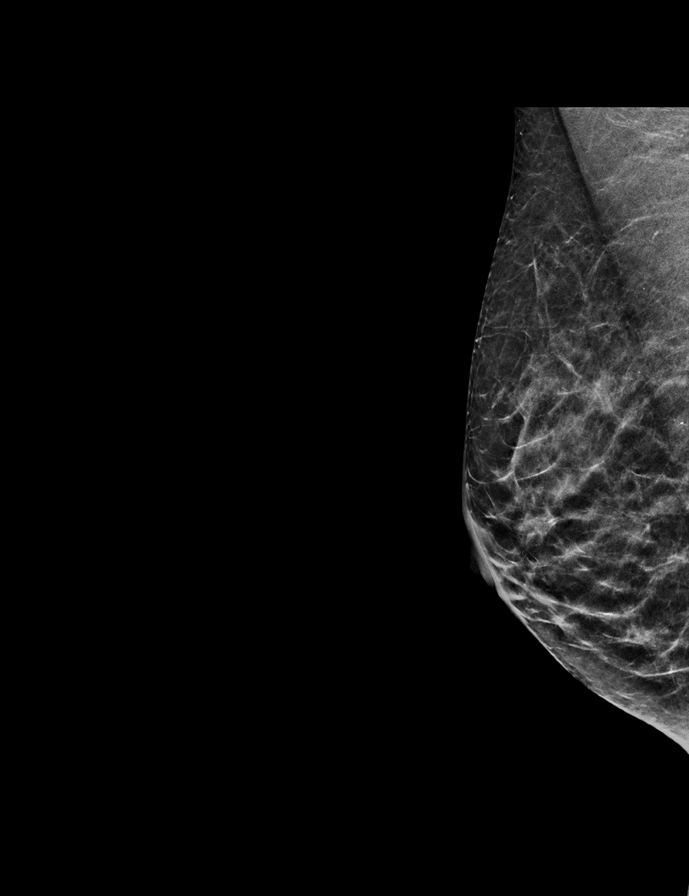

[R CC tomo · tomo slice 25/49.0]
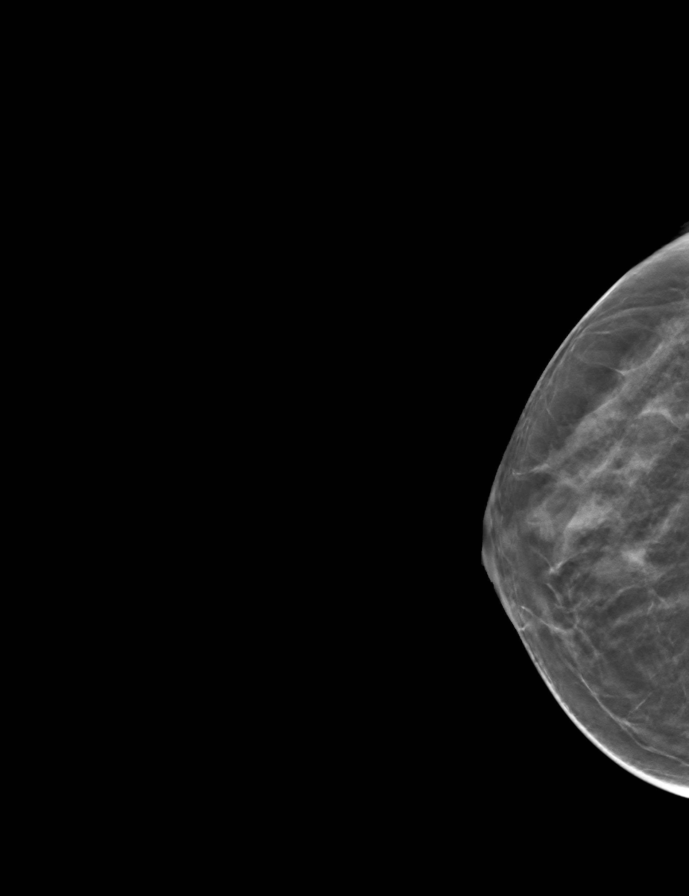

[9 of 28 positions shown; findings below may reference images not displayed]

ACR Breast Density Category b: There are scattered areas of
fibroglandular density.
FINDINGS: There are no findings suspicious for malignancy. Images were
processed with CAD.
IMPRESSION: No mammographic evidence of malignancy. A result letter of this
screening mammogram will be mailed directly to the patient.

RECOMMENDATION:
Screening mammogram in one year. (Code:97-6-RS4)

BI-RADS CATEGORY  1: Negative.

## 2018-05-26 ENCOUNTER — Ambulatory Visit: Payer: Managed Care, Other (non HMO) | Admitting: Family Medicine

## 2018-06-18 ENCOUNTER — Other Ambulatory Visit: Payer: Self-pay | Admitting: Physician Assistant

## 2018-06-18 DIAGNOSIS — Z1231 Encounter for screening mammogram for malignant neoplasm of breast: Secondary | ICD-10-CM

## 2019-03-29 ENCOUNTER — Other Ambulatory Visit: Payer: Self-pay | Admitting: Physician Assistant

## 2019-03-29 DIAGNOSIS — Z1231 Encounter for screening mammogram for malignant neoplasm of breast: Secondary | ICD-10-CM

## 2019-07-23 ENCOUNTER — Ambulatory Visit
Admission: RE | Admit: 2019-07-23 | Discharge: 2019-07-23 | Disposition: A | Discharge status: Home / Self Care | Payer: No Typology Code available for payment source | Source: Ambulatory Visit | Attending: Physician Assistant | Admitting: Physician Assistant

## 2019-07-23 DIAGNOSIS — Z1231 Encounter for screening mammogram for malignant neoplasm of breast: Secondary | ICD-10-CM | POA: Diagnosis not present

## 2020-05-24 ENCOUNTER — Encounter: Payer: Self-pay | Admitting: Certified Nurse Midwife

## 2020-12-28 ENCOUNTER — Other Ambulatory Visit: Payer: Self-pay | Admitting: Physician Assistant

## 2020-12-28 DIAGNOSIS — Z1231 Encounter for screening mammogram for malignant neoplasm of breast: Secondary | ICD-10-CM

## 2021-01-06 LAB — EXTERNAL GENERIC LAB PROCEDURE

## 2021-01-06 LAB — COLOGUARD

## 2022-02-21 ENCOUNTER — Other Ambulatory Visit: Payer: Self-pay | Admitting: Physician Assistant

## 2022-02-21 DIAGNOSIS — Z1231 Encounter for screening mammogram for malignant neoplasm of breast: Secondary | ICD-10-CM

## 2022-04-25 ENCOUNTER — Ambulatory Visit
Admission: RE | Admit: 2022-04-25 | Discharge: 2022-04-25 | Disposition: A | Discharge status: Home / Self Care | Payer: BC Managed Care – PPO | Source: Ambulatory Visit | Attending: Physician Assistant | Admitting: Physician Assistant

## 2022-04-25 DIAGNOSIS — Z1231 Encounter for screening mammogram for malignant neoplasm of breast: Secondary | ICD-10-CM | POA: Diagnosis not present

## 2022-07-05 LAB — COLOGUARD: COLOGUARD: NEGATIVE

## 2022-07-05 LAB — EXTERNAL GENERIC LAB PROCEDURE: COLOGUARD: NEGATIVE

## 2023-04-07 ENCOUNTER — Encounter: Payer: Self-pay | Admitting: Physician Assistant

## 2023-05-29 ENCOUNTER — Other Ambulatory Visit: Payer: Self-pay | Admitting: Physician Assistant

## 2023-05-29 DIAGNOSIS — Z1231 Encounter for screening mammogram for malignant neoplasm of breast: Secondary | ICD-10-CM

## 2023-05-30 ENCOUNTER — Other Ambulatory Visit: Payer: Self-pay | Admitting: Physician Assistant

## 2023-05-30 DIAGNOSIS — Z720 Tobacco use: Secondary | ICD-10-CM

## 2023-05-30 DIAGNOSIS — Z0000 Encounter for general adult medical examination without abnormal findings: Secondary | ICD-10-CM

## 2023-05-30 DIAGNOSIS — F17210 Nicotine dependence, cigarettes, uncomplicated: Secondary | ICD-10-CM

## 2023-06-12 ENCOUNTER — Ambulatory Visit
Admission: RE | Admit: 2023-06-12 | Discharge: 2023-06-12 | Disposition: A | Discharge status: Home / Self Care | Payer: BC Managed Care – PPO | Source: Ambulatory Visit | Attending: Physician Assistant | Admitting: Physician Assistant

## 2023-06-12 DIAGNOSIS — Z1231 Encounter for screening mammogram for malignant neoplasm of breast: Secondary | ICD-10-CM | POA: Diagnosis present

## 2023-06-26 ENCOUNTER — Ambulatory Visit
Admission: RE | Admit: 2023-06-26 | Discharge: 2023-06-26 | Disposition: A | Discharge status: Home / Self Care | Payer: BC Managed Care – PPO | Source: Ambulatory Visit | Attending: Physician Assistant | Admitting: Physician Assistant

## 2023-06-26 DIAGNOSIS — Z0000 Encounter for general adult medical examination without abnormal findings: Secondary | ICD-10-CM | POA: Diagnosis present

## 2023-06-26 DIAGNOSIS — F17210 Nicotine dependence, cigarettes, uncomplicated: Secondary | ICD-10-CM | POA: Diagnosis present

## 2023-06-26 DIAGNOSIS — Z720 Tobacco use: Secondary | ICD-10-CM | POA: Insufficient documentation

## 2024-06-09 ENCOUNTER — Other Ambulatory Visit: Payer: Self-pay | Admitting: Physician Assistant

## 2024-06-09 DIAGNOSIS — Z1231 Encounter for screening mammogram for malignant neoplasm of breast: Secondary | ICD-10-CM

## 2024-06-14 ENCOUNTER — Other Ambulatory Visit: Payer: Self-pay | Admitting: Physician Assistant

## 2024-06-14 DIAGNOSIS — F17210 Nicotine dependence, cigarettes, uncomplicated: Secondary | ICD-10-CM

## 2024-06-14 DIAGNOSIS — Z0000 Encounter for general adult medical examination without abnormal findings: Secondary | ICD-10-CM

## 2024-06-14 DIAGNOSIS — Z720 Tobacco use: Secondary | ICD-10-CM

## 2024-06-24 ENCOUNTER — Ambulatory Visit
Admission: RE | Admit: 2024-06-24 | Discharge: 2024-06-24 | Disposition: A | Discharge status: Home / Self Care | Source: Ambulatory Visit | Attending: Physician Assistant | Admitting: Physician Assistant

## 2024-06-24 DIAGNOSIS — Z720 Tobacco use: Secondary | ICD-10-CM | POA: Diagnosis present

## 2024-06-24 DIAGNOSIS — Z0000 Encounter for general adult medical examination without abnormal findings: Secondary | ICD-10-CM | POA: Insufficient documentation

## 2024-06-24 DIAGNOSIS — F17210 Nicotine dependence, cigarettes, uncomplicated: Secondary | ICD-10-CM | POA: Insufficient documentation

## 2024-07-06 ENCOUNTER — Encounter
